# Patient Record
Sex: Male | Born: 1980 | Hispanic: Yes | Marital: Single | Smoking: Never smoker
Health system: Southern US, Community
[De-identification: ages and names within clinical notes are randomized; demographics above are authoritative.]

---

## 2013-05-01 ENCOUNTER — Emergency Department (HOSPITAL_COMMUNITY)
Admission: EM | Admit: 2013-05-01 | Discharge: 2013-05-01 | Disposition: A | Discharge status: Home / Self Care | Payer: Self-pay | Attending: Emergency Medicine | Admitting: Emergency Medicine

## 2013-05-01 ENCOUNTER — Emergency Department (HOSPITAL_COMMUNITY): Payer: Self-pay

## 2013-05-01 ENCOUNTER — Encounter (HOSPITAL_COMMUNITY): Payer: Self-pay | Admitting: Emergency Medicine

## 2013-05-01 DIAGNOSIS — IMO0002 Reserved for concepts with insufficient information to code with codable children: Secondary | ICD-10-CM | POA: Insufficient documentation

## 2013-05-01 DIAGNOSIS — S0003XA Contusion of scalp, initial encounter: Secondary | ICD-10-CM | POA: Insufficient documentation

## 2013-05-01 DIAGNOSIS — R55 Syncope and collapse: Secondary | ICD-10-CM | POA: Insufficient documentation

## 2013-05-01 DIAGNOSIS — R296 Repeated falls: Secondary | ICD-10-CM | POA: Insufficient documentation

## 2013-05-01 DIAGNOSIS — S20229A Contusion of unspecified back wall of thorax, initial encounter: Secondary | ICD-10-CM | POA: Insufficient documentation

## 2013-05-01 DIAGNOSIS — Y929 Unspecified place or not applicable: Secondary | ICD-10-CM | POA: Insufficient documentation

## 2013-05-01 DIAGNOSIS — Y990 Civilian activity done for income or pay: Secondary | ICD-10-CM | POA: Insufficient documentation

## 2013-05-01 DIAGNOSIS — R51 Headache: Secondary | ICD-10-CM | POA: Insufficient documentation

## 2013-05-01 DIAGNOSIS — S1093XA Contusion of unspecified part of neck, initial encounter: Secondary | ICD-10-CM

## 2013-05-01 MED ORDER — HYDROCODONE-ACETAMINOPHEN 5-325 MG PO TABS
2.0000 | ORAL_TABLET | Freq: Once | ORAL | Status: AC
  Administered 2013-05-01: 2 via ORAL
  Filled 2013-05-01: qty 2

## 2013-05-01 MED ORDER — HYDROCODONE-ACETAMINOPHEN 5-325 MG PO TABS: 2.0000 | ORAL_TABLET | ORAL | Status: DC | PRN

## 2013-05-01 NOTE — ED Notes (Signed)
Per pt sts fall yesterday a few feet and landed on his back. sts when he stood up he took a few steps and then passed out. sts the pain was severe. sts hit his head when he fell and soft spot on head.

## 2013-05-01 NOTE — ED Provider Notes (Signed)
CSN: [id number]     Arrival date & time 05/01/13  1222 History   First MD Initiated Contact with Patient 05/01/13 1234     Chief Complaint  Patient presents with  . Fall   (Consider location/radiation/quality/duration/timing/severity/associated sxs/prior Treatment) Patient is a 32 y.o. male presenting with fall. The history is provided by the patient. No language interpreter was used.  Fall This is a new problem. The current episode started yesterday. The problem occurs constantly. The problem has been gradually worsening. Associated symptoms include headaches and neck pain. Nothing aggravates the symptoms. He has tried nothing for the symptoms. The treatment provided moderate relief.  Pt fell yesterday.  He hit his low back.  Pt reports when coworkers helped him up he passed out and struck his head on a 2x4.   Pt complains of continued headache.  Pt complains of a bruised area to the back of his head.  History reviewed. No pertinent past medical history. History reviewed. No pertinent past surgical history. History reviewed. No pertinent family history. History  Substance Use Topics  . Smoking status: Never Smoker   . Smokeless tobacco: Not on file  . Alcohol Use: Yes    Review of Systems  Musculoskeletal: Positive for back pain and neck pain.  Neurological: Positive for headaches.  All other systems reviewed and are negative.    Allergies  Review of patient's allergies indicates no known allergies.  Home Medications   Current Outpatient Rx  Name  Route  Sig  Dispense  Refill  . ibuprofen (ADVIL,MOTRIN) 200 MG tablet   Oral   Take 400 mg by mouth every 6 (six) hours as needed.          BP 128/84  Pulse 66  Temp(Src) 98.4 F (36.9 C) (Oral)  Resp 16  Ht 5\' 5"  (1.651 m)  Wt 159 lb 11.2 oz (72.439 kg)  BMI 26.58 kg/m2  SpO2 99% Physical Exam  Nursing note and vitals reviewed. Constitutional: He is oriented to person, place, and time. He appears well-developed and  well-nourished.  HENT:  Head: Normocephalic.  Right Ear: External ear normal.  Left Ear: External ear normal.  Mouth/Throat: Oropharynx is clear and moist.  Bruised occipital scalp  Eyes: Conjunctivae and EOM are normal. Pupils are equal, round, and reactive to light.  Neck: Normal range of motion. Neck supple.  Cardiovascular: Normal rate and normal heart sounds.   Pulmonary/Chest: Effort normal and breath sounds normal.  Abdominal: Soft. He exhibits no distension.  Musculoskeletal: Normal range of motion.  Neurological: He is alert and oriented to person, place, and time.  Skin: Skin is warm.  Psychiatric: He has a normal mood and affect.    ED Course  Procedures (including critical care time) Labs Review Labs Reviewed - No data to display Imaging Review Dg Cervical Spine Complete  05/01/2013   CLINICAL DATA:  Head and neck trauma secondary to a fall.  EXAM: CERVICAL SPINE  4+ VIEWS  COMPARISON:  None.  FINDINGS: There is no evidence of cervical spine fracture or prevertebral soft tissue swelling. Alignment is normal. No other significant bone abnormalities are identified.  IMPRESSION: Normal exam.   Electronically Signed   By: Geanie Cooley M.D.   On: 05/01/2013 14:28   Dg Thoracic Spine 2 View  05/01/2013   CLINICAL DATA:  Multiple trauma and pain secondary to a fall today.  EXAM: THORACIC SPINE - 2 VIEW  COMPARISON:  None.  FINDINGS: There is no evidence of thoracic spine fracture.  Alignment is normal. No other significant bone abnormalities are identified.  IMPRESSION: No significant abnormality.   Electronically Signed   By: Geanie Cooley M.D.   On: 05/01/2013 14:32   Dg Lumbar Spine Complete  05/01/2013   CLINICAL DATA:  Pain secondary to a fall today.  EXAM: LUMBAR SPINE - COMPLETE 4+ VIEW  COMPARISON:  None.  FINDINGS: There are bilateral pars defects at L5 with a grade 1 spondylolisthesis. I doubt that these are acute. The remainder of the lumbar spine is normal.  IMPRESSION:  No acute abnormality. 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   Electronically Signed   By: Geanie Cooley M.D.   On: 05/01/2013 14:30   Dg Pelvis 1-2 Views  05/01/2013   CLINICAL DATA:  Multiple trauma secondary to a fall today.  EXAM: PELVIS - 1-2 VIEW  COMPARISON:  None.  FINDINGS: There is no evidence of pelvic fracture or diastasis. No other pelvic bone lesions are seen.  IMPRESSION: Normal exam.   Electronically Signed   By: Geanie Cooley M.D.   On: 05/01/2013 14:30   Ct Head Wo Contrast  05/01/2013   CLINICAL DATA:  Fall  EXAM: CT HEAD WITHOUT CONTRAST  TECHNIQUE: Contiguous axial images were obtained from the base of the skull through the vertex without intravenous contrast.  COMPARISON:  None.  FINDINGS: The cerebral and cerebellar hemispheres have a normal attenuation and morphology. No midline shift, ventriculomegaly, mass effect, evidence of mass lesion, intracranial hemorrhage or evidence of cortically based acute infarction. Gray-white matter differentiation is within normal limits throughout the brain. The paranasal sinuses and mastoid air cells appear clear. The skull is intact. Left posterior scalp hematoma is identified, image 21/series 2.  IMPRESSION: 1. Scalp hematoma. 2. No acute intracranial findings.   Electronically Signed   By: Signa Kell M.D.   On: 05/01/2013 13:58    EKG Interpretation   None     No results found for this or any previous visit. Dg Cervical Spine Complete  05/01/2013   CLINICAL DATA:  Head and neck trauma secondary to a fall.  EXAM: CERVICAL SPINE  4+ VIEWS  COMPARISON:  None.  FINDINGS: There is no evidence of cervical spine fracture or prevertebral soft tissue swelling. Alignment is normal. No other significant bone abnormalities are identified.  IMPRESSION: Normal exam.   Electronically Signed   By: Geanie Cooley M.D.   On: 05/01/2013 14:28   Dg Thoracic Spine 2 View  05/01/2013   CLINICAL DATA:  Multiple trauma and pain secondary  to a fall today.  EXAM: THORACIC SPINE - 2 VIEW  COMPARISON:  None.  FINDINGS: There is no evidence of thoracic spine fracture. Alignment is normal. No other significant bone abnormalities are identified.  IMPRESSION: No significant abnormality.   Electronically Signed   By: Geanie Cooley M.D.   On: 05/01/2013 14:32   Dg Lumbar Spine Complete  05/01/2013   CLINICAL DATA:  Pain secondary to a fall today.  EXAM: LUMBAR SPINE - COMPLETE 4+ VIEW  COMPARISON:  None.  FINDINGS: There are bilateral pars defects at L5 with a grade 1 spondylolisthesis. I doubt that these are acute. The remainder of the lumbar spine is normal.  IMPRESSION: No acute abnormality. 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   Electronically Signed   By: Geanie Cooley M.D.   On: 05/01/2013 14:30   Dg Pelvis 1-2 Views  05/01/2013   CLINICAL DATA:  Multiple trauma  secondary to a fall today.  EXAM: PELVIS - 1-2 VIEW  COMPARISON:  None.  FINDINGS: There is no evidence of pelvic fracture or diastasis. No other pelvic bone lesions are seen.  IMPRESSION: Normal exam.   Electronically Signed   By: Geanie Cooley M.D.   On: 05/01/2013 14:30   Ct Head Wo Contrast  05/01/2013   CLINICAL DATA:  Fall  EXAM: CT HEAD WITHOUT CONTRAST  TECHNIQUE: Contiguous axial images were obtained from the base of the skull through the vertex without intravenous contrast.  COMPARISON:  None.  FINDINGS: The cerebral and cerebellar hemispheres have a normal attenuation and morphology. No midline shift, ventriculomegaly, mass effect, evidence of mass lesion, intracranial hemorrhage or evidence of cortically based acute infarction. Gray-white matter differentiation is within normal limits throughout the brain. The paranasal sinuses and mastoid air cells appear clear. The skull is intact. Left posterior scalp hematoma is identified, image 21/series 2.  IMPRESSION: 1. Scalp hematoma. 2. No acute intracranial findings.   Electronically Signed   By: Signa Kell M.D.   On: 05/01/2013 13:58     MDM   1. Contusion of scalp, initial encounter   2. Contusion of neck, initial encounter   3. Contusion, back, unspecified laterality, initial encounter    Rx for pain medication     Elson Areas, PA-C 05/01/13 1451

## 2013-05-02 NOTE — ED Provider Notes (Signed)
Medical screening examination/treatment/procedure(s) were performed by non-physician practitioner and as supervising physician I was immediately available for consultation/collaboration.    Paylin Hailu L Dossie Ocanas, MD 05/02/13 0740 

## 2014-11-04 ENCOUNTER — Telehealth: Payer: Self-pay | Admitting: *Deleted

## 2015-05-23 IMAGING — CR DG PELVIS 1-2V
1 series · 1 of 1 positions shown · non-contrast
Comparison: None.

CLINICAL DATA: Multiple trauma secondary to a fall today.

EXAM:
PELVIS - 1-2 VIEW

[t pelvis a.p.]
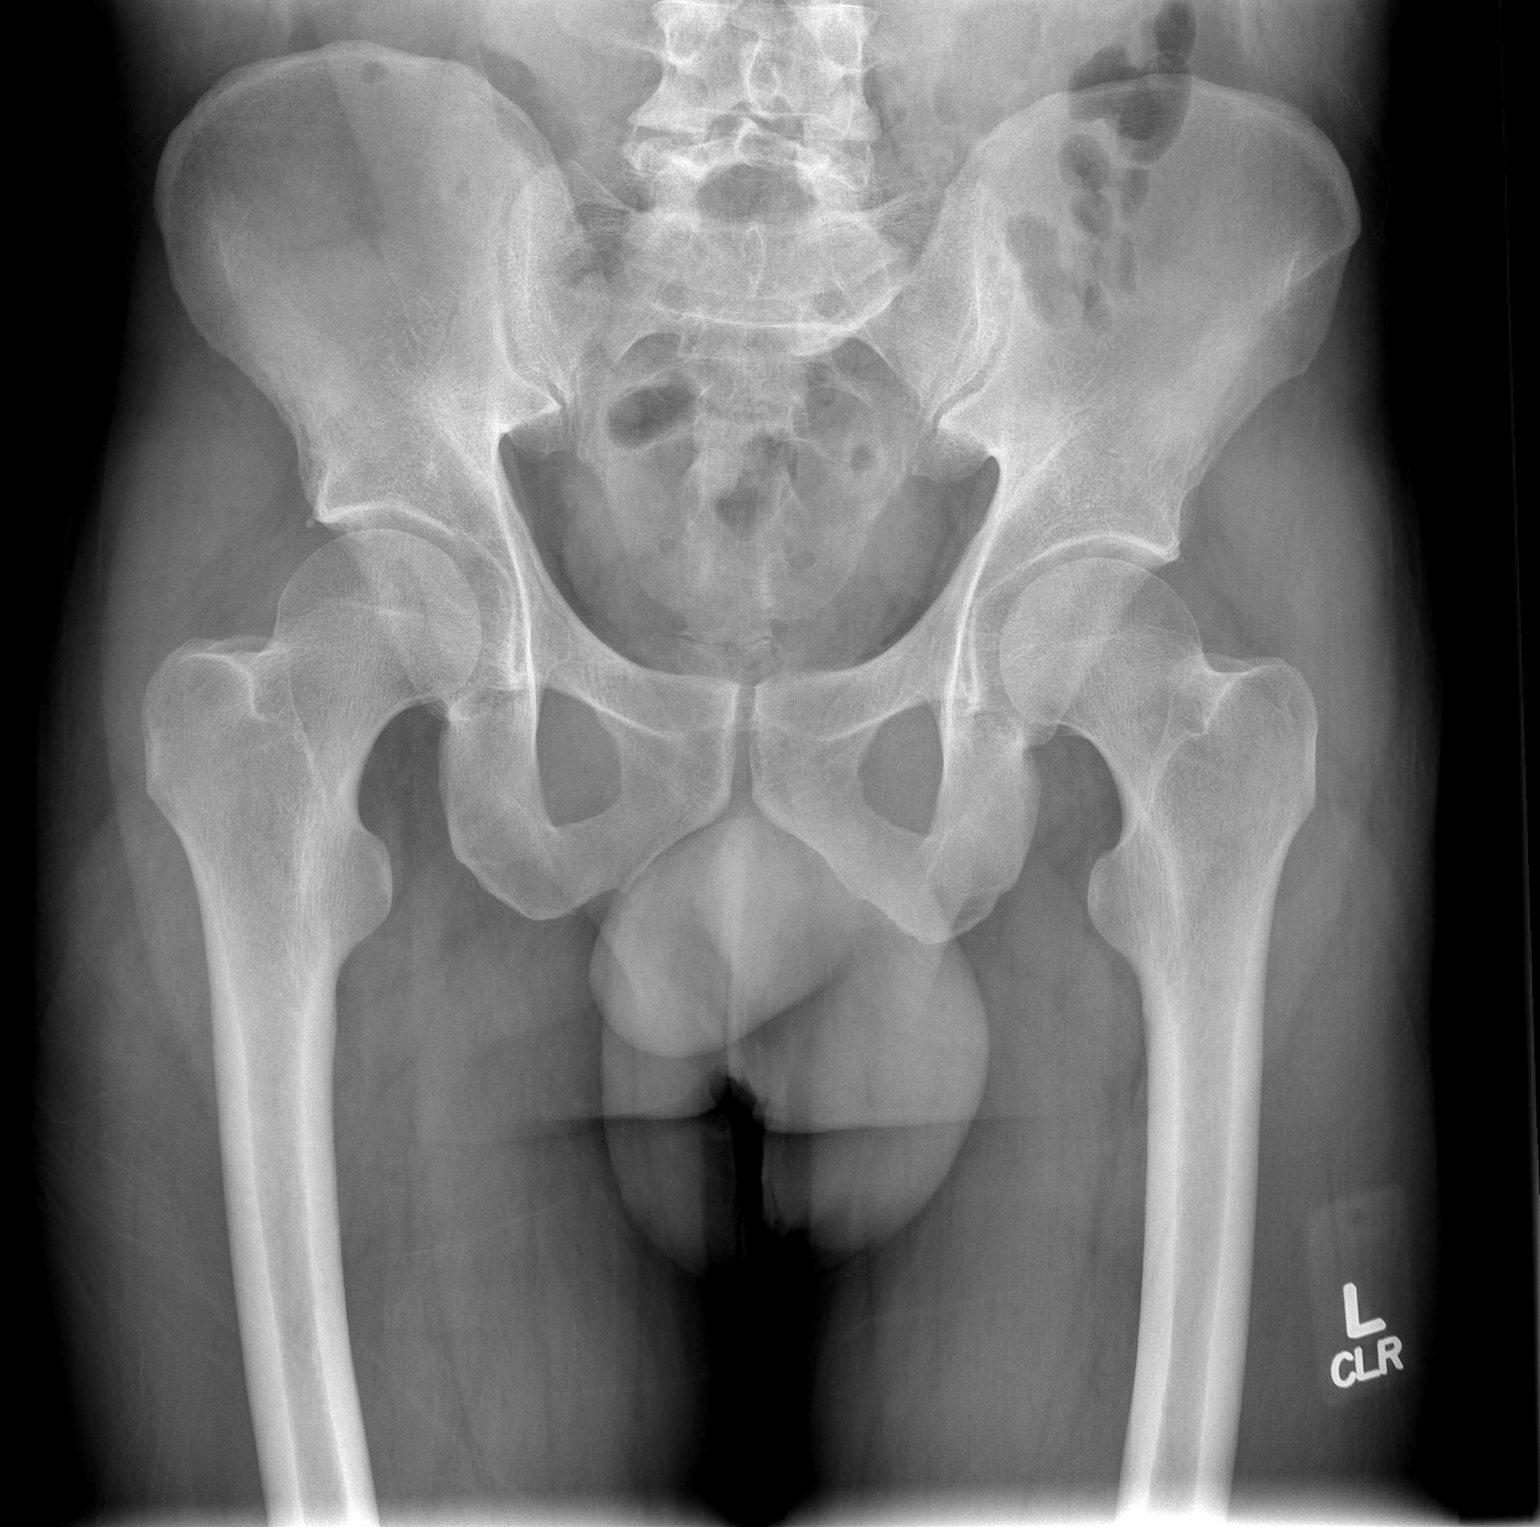

[1 of 1 positions shown; findings below may reference images not displayed]

FINDINGS: There is no evidence of pelvic fracture or diastasis. No other
pelvic bone lesions are seen.
IMPRESSION: Normal exam.

## 2022-11-18 ENCOUNTER — Ambulatory Visit (INDEPENDENT_AMBULATORY_CARE_PROVIDER_SITE_OTHER): Payer: Self-pay

## 2022-11-18 ENCOUNTER — Ambulatory Visit (HOSPITAL_COMMUNITY)
Admission: EM | Admit: 2022-11-18 | Discharge: 2022-11-18 | Disposition: A | Discharge status: Home / Self Care | Payer: Self-pay | Attending: Internal Medicine | Admitting: Internal Medicine

## 2022-11-18 ENCOUNTER — Encounter (HOSPITAL_COMMUNITY): Payer: Self-pay

## 2022-11-18 DIAGNOSIS — S8002XA Contusion of left knee, initial encounter: Secondary | ICD-10-CM

## 2022-11-18 DIAGNOSIS — W19XXXA Unspecified fall, initial encounter: Secondary | ICD-10-CM

## 2022-11-18 DIAGNOSIS — T148XXA Other injury of unspecified body region, initial encounter: Secondary | ICD-10-CM

## 2022-11-18 DIAGNOSIS — Z23 Encounter for immunization: Secondary | ICD-10-CM

## 2022-11-18 DIAGNOSIS — S9032XA Contusion of left foot, initial encounter: Secondary | ICD-10-CM

## 2022-11-18 MED ORDER — TETANUS-DIPHTH-ACELL PERTUSSIS 5-2.5-18.5 LF-MCG/0.5 IM SUSY
PREFILLED_SYRINGE | INTRAMUSCULAR | Status: AC
  Filled 2022-11-18: qty 0.5

## 2022-11-18 MED ORDER — TETANUS-DIPHTH-ACELL PERTUSSIS 5-2.5-18.5 LF-MCG/0.5 IM SUSY
0.5000 mL | PREFILLED_SYRINGE | Freq: Once | INTRAMUSCULAR | Status: AC
  Administered 2022-11-18: 0.5 mL via INTRAMUSCULAR

## 2022-11-18 MED ORDER — MUPIROCIN 2 % EX OINT: 1.0000 | TOPICAL_OINTMENT | Freq: Two times a day (BID) | CUTANEOUS | 0 refills | Status: AC

## 2022-11-18 MED ORDER — IBUPROFEN 800 MG PO TABS: 800.0000 mg | ORAL_TABLET | Freq: Three times a day (TID) | ORAL | 0 refills | Status: AC

## 2022-11-18 NOTE — ED Triage Notes (Signed)
Patient here today due to an injury to his left knee and back of left foot towards the heel. Patient was in the attic and the door to the attic was open and he fell through all the way.

## 2022-11-18 NOTE — Discharge Instructions (Signed)
Your x-rays were reassuring.  Please keep your leg elevated and use ice multiple times a day.  Use the brace, postop shoe, crutches and bear weight as tolerated.  Follow-up with orthopedics.  Call them to schedule appointment as soon as possible.  Take ibuprofen 800 mg for pain.  Do not take NSAIDs with this medication including aspirin, ibuprofen/Advil, naproxen/Aleve.  You can use acetaminophen/Tylenol for breakthrough pain.  Keep the wounds on your leg clean with soap and water.  Apply Bactroban ointment twice daily.  We updated your tetanus today.  If you have any worsening or changing symptoms including increasing pain, decreased range of motion, swelling, numbness, tingling, headache, nausea, vomiting you need to be seen immediately.

## 2022-11-18 NOTE — ED Provider Notes (Signed)
[facility]    CSN: [id number] Arrival date & time: 11/18/22  4010      History   Chief Complaint Chief Complaint  Patient presents with   Fall    HPI Isaac Mckay is a 42 y.o. male.   Patient presents today with a several hour history of left knee and foot pain following injury.  He is Spanish-speaking and video interpreter was utilized during visit.  Reports that he was working in the attic when he took a Designer, television/film set and fell through an open area of the attic.  The majority of his weight landed on his left leg.  He did not hit his head denies any loss of consciousness, nausea, vomiting, headache, dizziness, amnesia surrounding event.  He does not take any blood thinning medications on a regular basis.  He reports that pain is rated 7 on a 0-10 pain scale, localized to lateral left knee as well as left heel, described as throbbing, no aggravating or alleviating factors identified.  He denies previous injury or surgery involving ankle, foot, knee.  He is having difficulty bearing weight as result of the pain.  He has not tried any over-the-counter medication for symptom management.  Denies any numbness or paresthesias in the foot.    History reviewed. No pertinent past medical history.  There are no problems to display for this patient.   History reviewed. No pertinent surgical history.     Home Medications    Prior to Admission medications   Medication Sig Start Date End Date Taking? Authorizing Provider  ibuprofen (ADVIL) 800 MG tablet Take 1 tablet (800 mg total) by mouth 3 (three) times daily. 11/18/22  Yes Morning Halberg, Denny Peon K, PA-C  mupirocin ointment (BACTROBAN) 2 % Apply 1 Application topically 2 (two) times daily. 11/18/22  Yes Neal Oshea, Noberto Retort, PA-C    Family History History reviewed. No pertinent family history.  Social History Social History   Tobacco Use   Smoking status: Never  Substance Use Topics   Alcohol use: Yes   Drug use: No     Allergies    Patient has no known allergies.   Review of Systems Review of Systems  Constitutional:  Positive for activity change. Negative for appetite change, fatigue and fever.  Eyes:  Negative for visual disturbance.  Respiratory:  Negative for cough and shortness of breath.   Cardiovascular:  Negative for chest pain.  Gastrointestinal:  Negative for abdominal pain, diarrhea, nausea and vomiting.  Musculoskeletal:  Positive for arthralgias and gait problem. Negative for joint swelling and myalgias.  Skin:  Positive for wound.  Neurological:  Negative for dizziness, weakness, light-headedness, numbness and headaches.     Physical Exam Triage Vital Signs ED Triage Vitals  Enc Vitals Group     BP 11/18/22 1649 136/89     Pulse Rate 11/18/22 1649 79     Resp 11/18/22 1649 17     Temp 11/18/22 1649 98.5 F (36.9 C)     Temp Source 11/18/22 1649 Oral     SpO2 11/18/22 1649 98 %     Weight --      Height 11/18/22 1649 5\' 5"  (1.651 m)     Head Circumference --      Peak Flow --      Pain Score 11/18/22 1648 8     Pain Loc --      Pain Edu? --      Excl. in GC? --    No data found.  Updated Vital Signs BP 136/89 (BP Location: Right Arm)   Pulse 79   Temp 98.5 F (36.9 C) (Oral)   Resp 17   Ht 5\' 5"  (1.651 m)   SpO2 98%   BMI 26.58 kg/m   Visual Acuity Right Eye Distance:   Left Eye Distance:   Bilateral Distance:    Right Eye Near:   Left Eye Near:    Bilateral Near:     Physical Exam Vitals reviewed.  Constitutional:      General: He is awake.     Appearance: Normal appearance. He is well-developed. He is not ill-appearing.     Comments: Very pleasant male appears stated age in no acute distress sitting comfortably in a wheel chair in exam room  HENT:     Head: Normocephalic and atraumatic.  Cardiovascular:     Rate and Rhythm: Normal rate and regular rhythm.     Heart sounds: Normal heart sounds, S1 normal and S2 normal. No murmur heard.    Comments: Capillary  refill within 2 seconds left toes Pulmonary:     Effort: Pulmonary effort is normal.     Breath sounds: Normal breath sounds. No stridor. No wheezing, rhonchi or rales.     Comments: Clear to auscultation bilaterally Musculoskeletal:     Left knee: Swelling present. Tenderness present over the lateral joint line. No medial joint line tenderness. No LCL laxity, MCL laxity, ACL laxity or PCL laxity.    Comments: Left knee: Swelling and multiple abrasions noted over lateral knee and into lower leg.  Normal active range of motion.  Strength 5/5 lower leg.  No ligamentous laxity on exam.  Left foot: Tenderness and pain over calcaneus.  Normal active range of motion at ankle.  No additional focal tenderness throughout the foot.  Foot is neurovascularly intact.  Neurological:     Mental Status: He is alert.  Psychiatric:        Behavior: Behavior is cooperative.      UC Treatments / Results  Labs (all labs ordered are listed, but only abnormal results are displayed) Labs Reviewed - No data to display  EKG   Radiology DG Foot Complete Left  Result Date: 11/18/2022 CLINICAL DATA:  limited ROM and swelling EXAM: LEFT FOOT - COMPLETE 3+ VIEW COMPARISON:  None Available. FINDINGS: No acute fracture or dislocation. Joint spaces and alignment are maintained. No area of erosion or osseous destruction. No unexpected radiopaque foreign body. Soft tissues are unremarkable. IMPRESSION: 1. No acute fracture or dislocation. 2. If persistent clinical concern for Lisfranc injury, recommend dedicated weight-bearing radiographs. Electronically Signed   By: Meda Klinefelter M.D.   On: 11/18/2022 17:31   DG Knee Complete 4 Views Left  Result Date: 11/18/2022 CLINICAL DATA:  limited ROM and swelling EXAM: LEFT KNEE - COMPLETE 4+ VIEW COMPARISON:  None Available. FINDINGS: No acute fracture or dislocation. Joint spaces and alignment are maintained. No area of erosion or osseous destruction. No unexpected  radiopaque foreign body. Soft tissues are unremarkable. IMPRESSION: 1. No acute fracture or dislocation. Electronically Signed   By: Meda Klinefelter M.D.   On: 11/18/2022 17:30    Procedures Procedures (including critical care time)  Medications Ordered in UC Medications  Tdap (BOOSTRIX) injection 0.5 mL (0.5 mLs Intramuscular Given 11/18/22 1738)    Initial Impression / Assessment and Plan / UC Course  I have reviewed the triage vital signs and the nursing notes.  Pertinent labs & imaging results that were available during my  care of the patient were reviewed by me and considered in my medical decision making (see chart for details).     Patient is well-appearing, afebrile, nontoxic, nontachycardic.  No indication for head or neck CT based on [place] CT rules.  X-ray of foot and knee were obtained and are pending.  Low suspicion for Lisfranc injury given pain is localized to his calcaneus without pain or tenderness in his midfoot.  He was started on ibuprofen 800 mg for pain relief.  Discussed that he is not to take NSAIDs with this medication but can use acetaminophen/Tylenol for breakthrough pain.  He has several abrasions and so I encouraged him to clean these regularly with soap and water and apply Bactroban ointment.  We do not have record of previous tetanus and so this was updated today.  He was placed in a knee brace as well as postop shoe and given crutches since he is having difficulty bearing weight.  He is to bear weight as tolerated.  Recommended close follow-up with orthopedics given mechanism of injury and ongoing pain.  He was given contact information for local provider with instruction call to schedule an appointment.  We discussed that if he has any worsening or changing symptoms he needs to be reevaluated immediately.  Strict return precautions given.  Work excuse note provided.  Final Clinical Impressions(s) / UC Diagnoses   Final diagnoses:  Contusion of left foot,  initial encounter  Contusion of left knee, initial encounter  Skin abrasion  Fall, initial encounter     Discharge Instructions      Your x-rays were reassuring.  Please keep your leg elevated and use ice multiple times a day.  Use the brace, postop shoe, crutches and bear weight as tolerated.  Follow-up with orthopedics.  Call them to schedule appointment as soon as possible.  Take ibuprofen 800 mg for pain.  Do not take NSAIDs with this medication including aspirin, ibuprofen/Advil, naproxen/Aleve.  You can use acetaminophen/Tylenol for breakthrough pain.  Keep the wounds on your leg clean with soap and water.  Apply Bactroban ointment twice daily.  We updated your tetanus today.  If you have any worsening or changing symptoms including increasing pain, decreased range of motion, swelling, numbness, tingling, headache, nausea, vomiting you need to be seen immediately.     ED Prescriptions     Medication Sig Dispense Auth. Provider   ibuprofen (ADVIL) 800 MG tablet Take 1 tablet (800 mg total) by mouth 3 (three) times daily. 21 tablet Lilyanah Celestin K, PA-C   mupirocin ointment (BACTROBAN) 2 % Apply 1 Application topically 2 (two) times daily. 22 g Myesha Stillion K, PA-C      PDMP not reviewed this encounter.   Jeani Hawking, PA-C 11/18/22 1834
